# Patient Record
Sex: Female | Born: 1985 | Hispanic: No | Marital: Single | Smoking: Never smoker
Health system: Southern US, Community
[De-identification: ages and names within clinical notes are randomized; demographics above are authoritative.]

---

## 2013-03-24 ENCOUNTER — Encounter (HOSPITAL_COMMUNITY): Payer: Self-pay | Admitting: Emergency Medicine

## 2013-03-24 ENCOUNTER — Emergency Department (INDEPENDENT_AMBULATORY_CARE_PROVIDER_SITE_OTHER): Payer: 59

## 2013-03-24 ENCOUNTER — Emergency Department (INDEPENDENT_AMBULATORY_CARE_PROVIDER_SITE_OTHER)
Admission: EM | Admit: 2013-03-24 | Discharge: 2013-03-24 | Disposition: A | Discharge status: Home / Self Care | Payer: 59 | Source: Home / Self Care | Attending: Family Medicine | Admitting: Family Medicine

## 2013-03-24 DIAGNOSIS — J40 Bronchitis, not specified as acute or chronic: Secondary | ICD-10-CM

## 2013-03-24 MED ORDER — AZITHROMYCIN 250 MG PO TABS: 250.0000 mg | ORAL_TABLET | Freq: Every day | ORAL | Status: DC

## 2013-03-24 MED ORDER — HYDROCODONE-ACETAMINOPHEN 5-325 MG PO TABS: 2.0000 | ORAL_TABLET | ORAL | Status: DC | PRN

## 2013-03-24 NOTE — ED Provider Notes (Signed)
CSN: [id number]     Arrival date & time 03/24/13  1011 History   First MD Initiated Contact with Patient 03/24/13 1108     Chief Complaint  Patient presents with  . Cough   (Consider location/radiation/quality/duration/timing/severity/associated sxs/prior Treatment) Patient is a 27 y.o. female presenting with cough. The history is provided by the patient. No language interpreter was used.  Cough Cough characteristics:  Non-productive Severity:  Moderate Onset quality:  Gradual Duration:  1 week Timing:  Constant Progression:  Worsening Chronicity:  New Smoker: no   Relieved by:  Nothing Worsened by:  Nothing tried Ineffective treatments:  None tried Associated symptoms: fever   Associated symptoms: no chest pain     History reviewed. No pertinent past medical history. History reviewed. No pertinent past surgical history. History reviewed. No pertinent family history. History  Substance Use Topics  . Smoking status: Never Smoker   . Smokeless tobacco: Not on file  . Alcohol Use: No   OB History   Grav Para Term Preterm Abortions TAB SAB Ect Mult Living                 Review of Systems  Constitutional: Positive for fever.  Respiratory: Positive for cough.   Cardiovascular: Negative for chest pain.  All other systems reviewed and are negative.    Allergies  Review of patient's allergies indicates no known allergies.  Home Medications  No current outpatient prescriptions on file. BP 110/75  Pulse 113  Temp(Src) 98.5 F (36.9 C) (Oral)  Resp 16  SpO2 100%  LMP 03/16/2013 Physical Exam  Nursing note and vitals reviewed. Constitutional: She is oriented to person, place, and time. She appears well-developed and well-nourished.  HENT:  Head: Normocephalic.  Right Ear: External ear normal.  Left Ear: External ear normal.  Nose: Nose normal.  Mouth/Throat: Oropharynx is clear and moist.  Eyes: Pupils are equal, round, and reactive to light.  Neck: Normal  range of motion. Neck supple.  Cardiovascular: Normal rate and normal heart sounds.   Pulmonary/Chest: Effort normal and breath sounds normal.  Abdominal: Soft.  Musculoskeletal: Normal range of motion.  Neurological: She is alert and oriented to person, place, and time. She has normal reflexes.  Skin: Skin is warm.  Psychiatric: She has a normal mood and affect.    ED Course  Procedures (including critical care time) Labs Review Labs Reviewed - No data to display Imaging Review Dg Chest 2 View  03/24/2013   CLINICAL DATA:  Chest pain  EXAM: CHEST  2 VIEW  COMPARISON:  None.  FINDINGS: Lungs are clear. Heart size and pulmonary vascularity are normal. No adenopathy. There is lower thoracic levoscoliosis. No pneumothorax.  IMPRESSION: No edema or consolidation.   Electronically Signed   By: Bretta Bang M.D.   On: 03/24/2013 12:10    EKG Interpretation    Date/Time:    Ventricular Rate:    PR Interval:    QRS Duration:   QT Interval:    QTC Calculation:   R Axis:     Text Interpretation:             Chest xray no pneumonia  I will treat with zithromax and hydrocodone MDM   1. Bronchitis       Elson Areas, PA-C 03/24/13 1233

## 2013-03-24 NOTE — ED Notes (Signed)
C/o persistent dry cough. Sore throat.  Chest/flank soreness from cough.  2 vomiting episodes from coughing.  No relief with otc meds.  Denies fever, n/v/d.  Symptoms present x 1 wk.

## 2013-03-25 NOTE — ED Provider Notes (Signed)
Medical screening examination/treatment/procedure(s) were performed by a resident physician or non-physician practitioner and as the supervising physician I was immediately available for consultation/collaboration.  Clementeen Graham, MD    Rodolph Bong, MD 03/25/13 [phone]

## 2013-08-03 ENCOUNTER — Emergency Department (HOSPITAL_COMMUNITY)
Admission: EM | Admit: 2013-08-03 | Discharge: 2013-08-04 | Disposition: A | Discharge status: Home / Self Care | Payer: 59 | Attending: Emergency Medicine | Admitting: Emergency Medicine

## 2013-08-03 DIAGNOSIS — Y93G1 Activity, food preparation and clean up: Secondary | ICD-10-CM | POA: Insufficient documentation

## 2013-08-03 DIAGNOSIS — W260XXA Contact with knife, initial encounter: Secondary | ICD-10-CM | POA: Insufficient documentation

## 2013-08-03 DIAGNOSIS — S61509A Unspecified open wound of unspecified wrist, initial encounter: Secondary | ICD-10-CM | POA: Insufficient documentation

## 2013-08-03 DIAGNOSIS — W261XXA Contact with sword or dagger, initial encounter: Secondary | ICD-10-CM

## 2013-08-03 DIAGNOSIS — IMO0002 Reserved for concepts with insufficient information to code with codable children: Secondary | ICD-10-CM

## 2013-08-03 DIAGNOSIS — Y92009 Unspecified place in unspecified non-institutional (private) residence as the place of occurrence of the external cause: Secondary | ICD-10-CM | POA: Insufficient documentation

## 2013-08-03 NOTE — ED Provider Notes (Signed)
CSN: [id number]     Arrival date & time 08/03/13  2129 History  This chart was scribed for non-physician practitioner, Earley FavorGail Verenis Nicosia, FNP working with Olivia Mackielga M Otter, MD by Greggory StallionKayla Andersen, ED scribe. This patient was seen in room TR06C/TR06C and the patient's care was started at 11:29 PM.   Chief Complaint  Patient presents with  . Laceration   The history is provided by the patient. No language interpreter was used.   HPI Comments: Evelyn NeitherMeenu Hewson is a 28 y.o. female who presents to the Emergency Department complaining of left wrist laceration that occurred prior to arrival. Pt was cutting vegetables and accidentally cut herself. She is unsure of her last tetanus.   No past medical history on file. No past surgical history on file. No family history on file. History  Substance Use Topics  . Smoking status: Never Smoker   . Smokeless tobacco: Not on file  . Alcohol Use: No   OB History   Grav Para Term Preterm Abortions TAB SAB Ect Mult Living                 Review of Systems  Skin: Positive for wound.  All other systems reviewed and are negative.  Allergies  Review of patient's allergies indicates no known allergies.  Home Medications   Prior to Admission medications   Medication Sig Start Date End Date Taking? Authorizing Provider  azithromycin (ZITHROMAX) 250 MG tablet Take 1 tablet (250 mg total) by mouth daily. Take first 2 tablets together, then 1 every day until finished. 03/24/13   Elson AreasLeslie K Sofia, PA-C  HYDROcodone-acetaminophen (NORCO/VICODIN) 5-325 MG per tablet Take 2 tablets by mouth every 4 (four) hours as needed for moderate pain. 03/24/13   Elson AreasLeslie K Sofia, PA-C   BP 129/79  Pulse 86  Temp(Src) 98.2 F (36.8 C) (Oral)  Resp 20  Wt 148 lb 5 oz (67.274 kg)  SpO2 100%  Physical Exam  Nursing note and vitals reviewed. Constitutional: She is oriented to person, place, and time. She appears well-developed and well-nourished. No distress.  HENT:  Head:  Normocephalic and atraumatic.  Eyes: EOM are normal.  Neck: Neck supple. No tracheal deviation present.  Cardiovascular: Normal rate.   Pulmonary/Chest: Effort normal. No respiratory distress.  Musculoskeletal: Normal range of motion.  3 cm superficial laceration approximately 3 cm proximal to the left wrist.   Neurological: She is alert and oriented to person, place, and time.  Skin: Skin is warm and dry.  Psychiatric: She has a normal mood and affect. Her behavior is normal.    ED Course  LACERATION REPAIR Date/Time: 08/04/2013 12:44 AM Performed by: Arman FilterSCHULZ, Tayllor Breitenstein K Authorized by: Arman FilterSCHULZ, Logan Vegh K Consent: Verbal consent obtained. written consent not obtained. Consent given by: patient Patient understanding: patient does not state understanding of the procedure being performed Patient identity confirmed: verbally with patient Body area: upper extremity Location details: left lower arm   (including critical care time)  DIAGNOSTIC STUDIES: Oxygen Saturation is 100% on RA, normal by my interpretation.    COORDINATION OF CARE: 11:31 PM-Discussed treatment plan which includes laceration repair with pt at bedside and pt agreed to plan.   LACERATION REPAIR PROCEDURE NOTE The patient's identification was confirmed and consent was obtained. This procedure was performed by Earley FavorGail Jillien Yakel, FNP at 11:58 PM. Site: left wrist Sterile procedures observed Anesthetic used (type and amt): none Suture type/size: dermabond Length: 3 cm # of Sutures: dermabond Technique: dermabond Complexity: simple Tetanus ordered Site anesthetized, irrigated  with NS, explored without evidence of foreign body, wound well approximated, site covered with dry, sterile dressing.  Patient tolerated procedure well without complications. Instructions for care discussed verbally and patient provided with additional written instructions for homecare and f/u.  Labs Review Labs Reviewed - No data to display  Imaging  Review No results found.   EKG Interpretation None      MDM  It was Dermabond it and Steri-Strips placed Final diagnoses:  Laceration       I personally performed the services described in this documentation, which was scribed in my presence. The recorded information has been reviewed and is accurate.  Arman FilterGail K Stephanie Littman, NP 08/04/13 [phone]

## 2013-08-03 NOTE — ED Notes (Signed)
Pt c/o left wrist laceration. Pt states she was cutting vegeatable when the knife turned and cut her wrist. Pt presents with a 5 cm laceration to left wrist, bleeding controlled.

## 2013-08-04 ENCOUNTER — Encounter (HOSPITAL_COMMUNITY): Payer: Self-pay | Admitting: Emergency Medicine

## 2013-08-04 NOTE — ED Provider Notes (Signed)
Medical screening examination/treatment/procedure(s) were performed by non-physician practitioner and as supervising physician I was immediately available for consultation/collaboration.   EKG Interpretation None       Dale Ribeiro M Mattheo Swindle, MD 08/04/13 0725 

## 2013-08-04 NOTE — Discharge Instructions (Signed)
Dermabond and Steri-Strips.  Have been applied.  These will wear off in  Several, days you will not  need any further treatment for your laceration

## 2014-05-25 IMAGING — CR DG CHEST 2V
2 series · 2 of 2 positions shown · non-contrast
Comparison: None.

CLINICAL DATA: Chest pain

EXAM:
CHEST  2 VIEW

[view not recorded (1 of 2)]
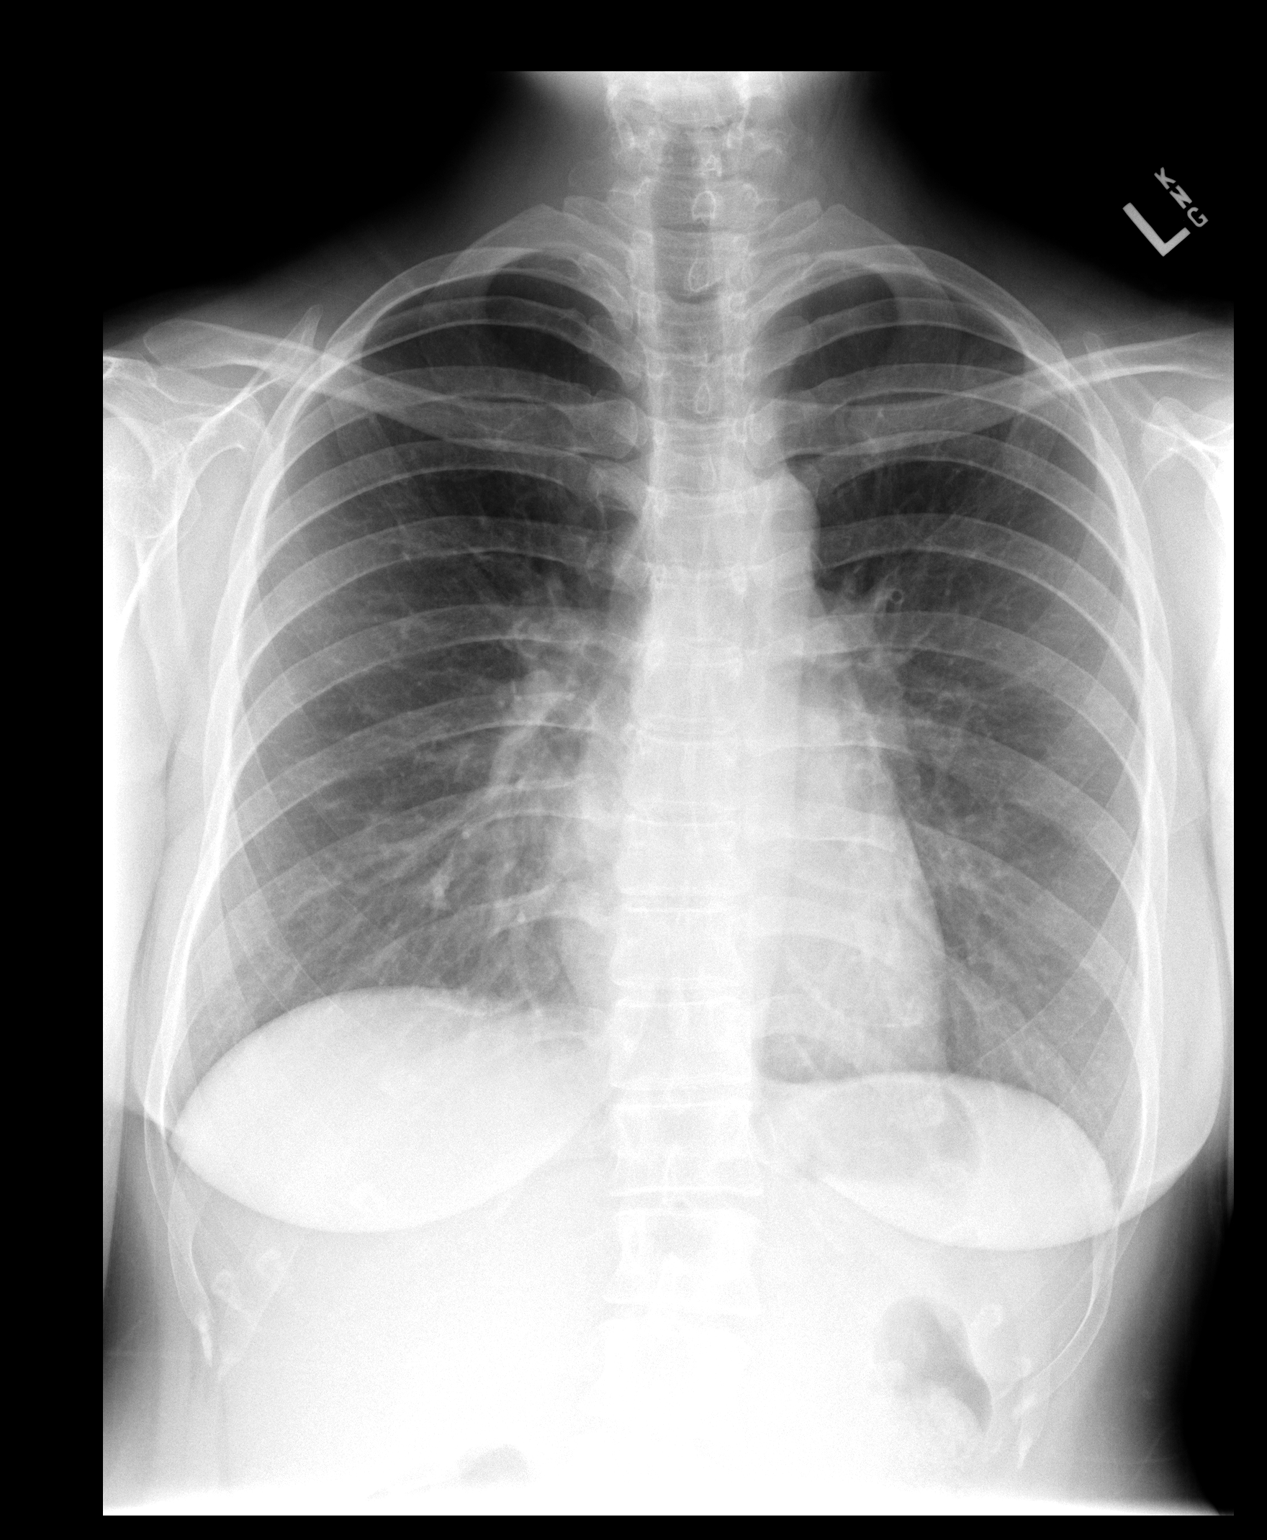

[view not recorded (2 of 2)]
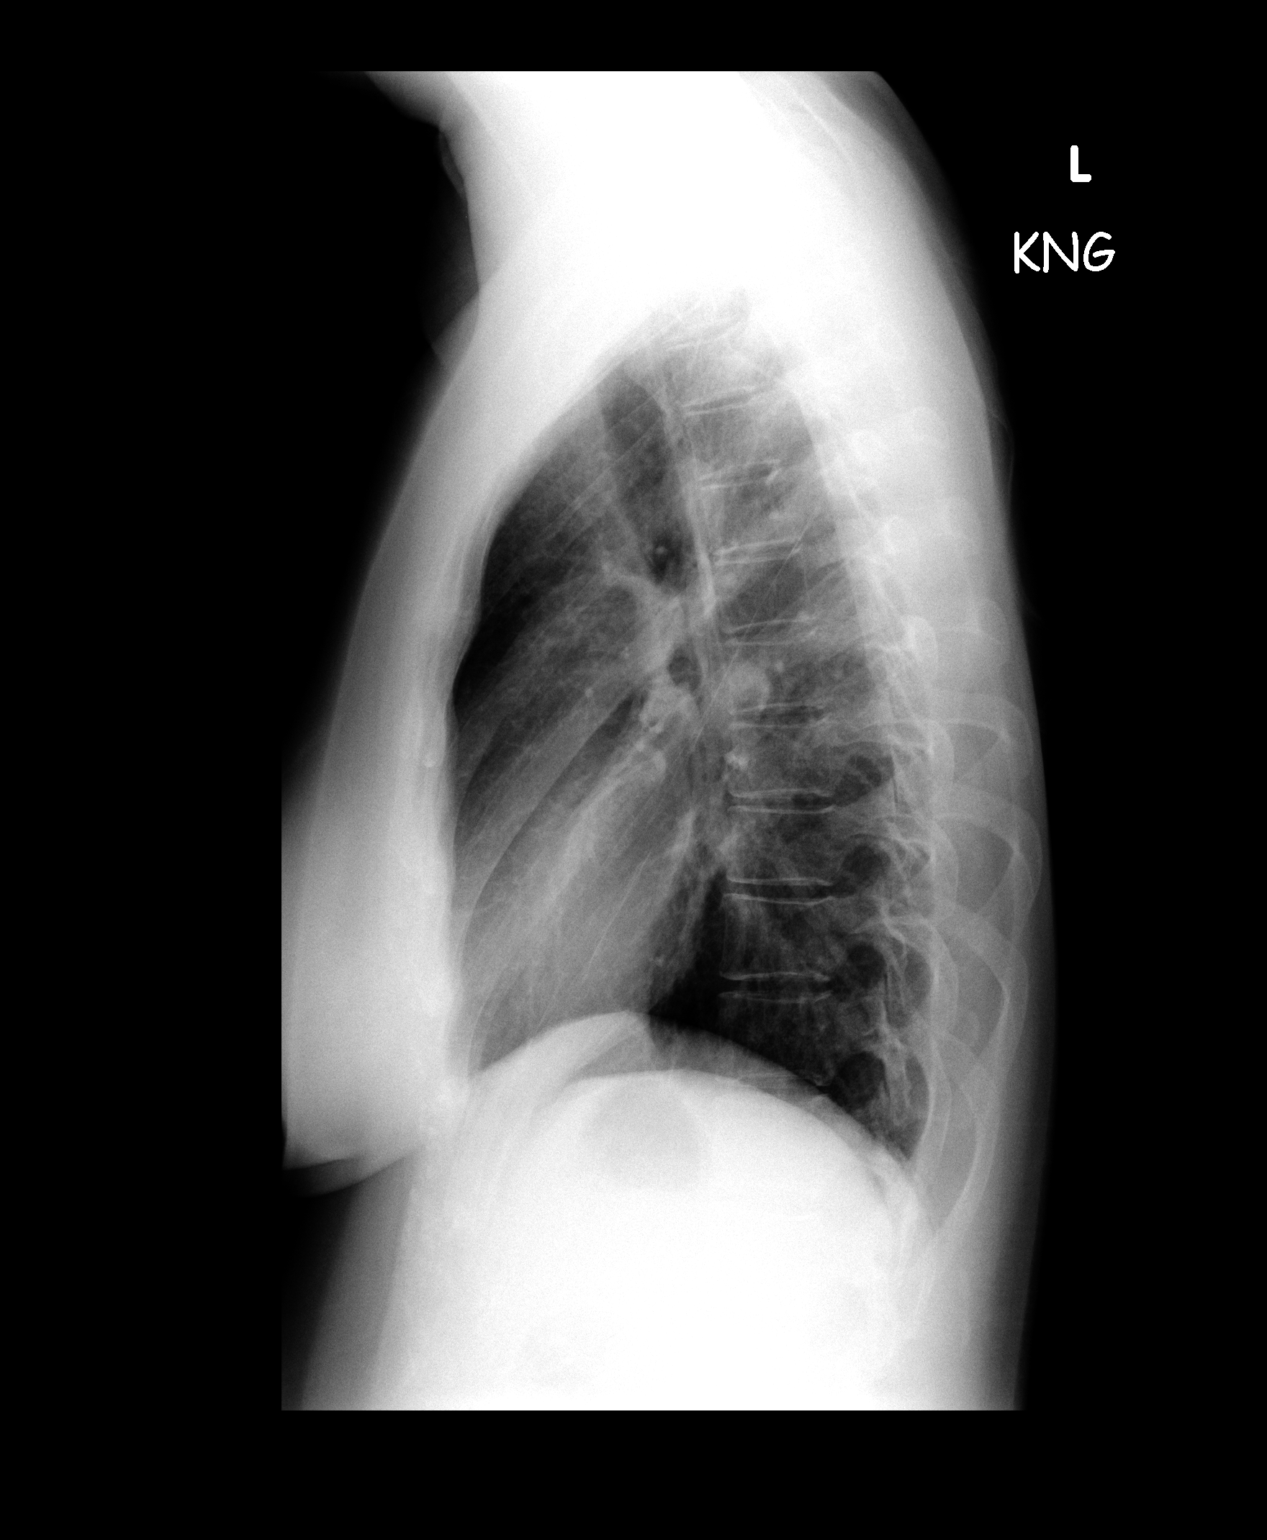

[2 of 2 positions shown; findings below may reference images not displayed]

FINDINGS: Lungs are clear. Heart size and pulmonary vascularity are normal. No
adenopathy. There is lower thoracic levoscoliosis. No pneumothorax.
IMPRESSION: No edema or consolidation.
